# Patient Record
Sex: Male | Born: 1985 | Race: Black or African American | Marital: Single | Smoking: Never smoker
Health system: Southern US, Community
[De-identification: ages and names within clinical notes are randomized; demographics above are authoritative.]

---

## 2013-02-24 ENCOUNTER — Emergency Department (HOSPITAL_COMMUNITY)
Admission: EM | Admit: 2013-02-24 | Discharge: 2013-02-24 | Disposition: A | Discharge status: Home / Self Care | Payer: Managed Care, Other (non HMO) | Attending: Emergency Medicine | Admitting: Emergency Medicine

## 2013-02-24 ENCOUNTER — Emergency Department (HOSPITAL_COMMUNITY): Payer: Managed Care, Other (non HMO)

## 2013-02-24 ENCOUNTER — Encounter (HOSPITAL_COMMUNITY): Payer: Self-pay | Admitting: Emergency Medicine

## 2013-02-24 DIAGNOSIS — N50812 Left testicular pain: Secondary | ICD-10-CM

## 2013-02-24 DIAGNOSIS — N509 Disorder of male genital organs, unspecified: Secondary | ICD-10-CM | POA: Insufficient documentation

## 2013-02-24 LAB — URINALYSIS, ROUTINE W REFLEX MICROSCOPIC
Glucose, UA: NEGATIVE mg/dL
Leukocytes, UA: NEGATIVE
Nitrite: NEGATIVE
Specific Gravity, Urine: 1.022 (ref 1.005–1.030)
pH: 5.5 (ref 5.0–8.0)

## 2013-02-24 MED ORDER — ACETAMINOPHEN 325 MG PO TABS
650.0000 mg | ORAL_TABLET | Freq: Once | ORAL | Status: AC
  Administered 2013-02-24: 650 mg via ORAL
  Filled 2013-02-24: qty 2

## 2013-02-24 NOTE — ED Notes (Signed)
Pt dc to home.   Pt states understanding to dc instructions.  Pt ambulatory to exit without difficulty.  Pt denies need for w/c. 

## 2013-02-24 NOTE — ED Provider Notes (Signed)
History    This chart was scribed for non-physician practitioner Arthor Captain, PA-C working with Joya Gaskins, MD by Gerlean Ren, ED Scribe. This patient was seen in room TR05C/TR05C and the patient's care was started at 11:16 PM.   CSN: [id number]  Arrival date & time 02/24/13  1901   First MD Initiated Contact with Patient 02/24/13 2039      Chief Complaint  Patient presents with  . Testicle Pain     The history is provided by the patient. No language interpreter was used.  Tanner Anderson is a 27 y.o. male who presents to the Emergency Department complaining of moderate left testicle pain with sudden onset 2 days ago and waxing-and-waning since.  No associated trauma or injury to the area.  Pt reports he had similar pain 2 months ago that resolved on its own.  No recent sexual intercourse.  Pt has used ibuprofen without relief.  Pt denies penile discharge, testicular swelling, rectal pain, nausea, emesis, abdominal pain, dysuria, hematuria.  No hx kidney stones.   History reviewed. No pertinent past medical history.  History reviewed. No pertinent past surgical history.  No family history on file.  History  Substance Use Topics  . Smoking status: Never Smoker   . Smokeless tobacco: Not on file  . Alcohol Use: Yes      Review of Systems  Gastrointestinal: Negative for nausea, vomiting and abdominal pain.  Genitourinary: Positive for testicular pain. Negative for dysuria, hematuria, discharge and penile pain.       Negative testicular swelling  All other systems reviewed and are negative.    Allergies  Review of patient's allergies indicates no known allergies.  Home Medications  No current outpatient prescriptions on file.  BP 129/88  Pulse 75  Temp(Src) 98.2 F (36.8 C) (Oral)  Resp 16  SpO2 99%  Physical Exam  Nursing note and vitals reviewed. Constitutional: He is oriented to person, place, and time. He appears well-developed and well-nourished. No  distress.  HENT:  Head: Normocephalic and atraumatic.  Eyes: EOM are normal.  Neck: Neck supple. No tracheal deviation present.  Cardiovascular: Normal rate.   Pulmonary/Chest: Effort normal. No respiratory distress.  Genitourinary: Penis normal.  Normal shape, no masses, not swollen, epididymus non-tender to palpation, circumcised, no swelling heat or redness of scrotum, no sign of hernia, penis is without discharge  Musculoskeletal: Normal range of motion.  Neurological: He is alert and oriented to person, place, and time.  Skin: Skin is warm and dry.  Psychiatric: He has a normal mood and affect. His behavior is normal.    ED Course  Procedures (including critical care time) DIAGNOSTIC STUDIES: Oxygen Saturation is 99% on room air, normal by my interpretation.    COORDINATION OF CARE: 11:24 PM- Informed pt that urinalysis and [place] are all normal and do not indicate any acute conditions.  Informed pt that I will provide contact information for urologist and advised follow-up.  Pt verbalizes understanding and agrees with plan.  Results for orders placed during the hospital encounter of 02/24/13  URINALYSIS, ROUTINE W REFLEX MICROSCOPIC      Result Value Range   Color, Urine YELLOW  YELLOW   APPearance CLEAR  CLEAR   Specific Gravity, Urine 1.022  1.005 - 1.030   pH 5.5  5.0 - 8.0   Glucose, UA NEGATIVE  NEGATIVE mg/dL   Hgb urine dipstick NEGATIVE  NEGATIVE   Bilirubin Urine NEGATIVE  NEGATIVE   Ketones, ur NEGATIVE  NEGATIVE  mg/dL   Protein, ur NEGATIVE  NEGATIVE mg/dL   Urobilinogen, UA 0.2  0.0 - 1.0 mg/dL   Nitrite NEGATIVE  NEGATIVE   Leukocytes, UA NEGATIVE  NEGATIVE    US Scrotum  02/24/2013  *RADIOLOGY REPORT*  Clinical Data:  Left testicular pain. Rule out testicular torsion.  SCROTAL ULTRASOUND DOPPLER ULTRASOUND OF THE TESTICLES  Technique: Complete ultrasound examination of the testicles, epididymis, and other scrotal structures was performed.  Color and spectral  Doppler ultrasound were also utilized to evaluate blood flow to the testicles.  Comparison:  None available.  Findings:  Right testis:  The right testis is of normal size and echotexture, measuring 3.4 x 1.5 x 2.0 cm.  Normal color Doppler flow is present.  Left testis:  The left testis is of normal size and echotexture, measuring 3.2 x 1.4 x 2.1 cm.  Normal color Doppler flow is present.  Right epididymis:  Normal in size and appearance.  Left epididymis:  Normal in size and appearance.  Hydrocele:  Absent  Varicocele:  Absent  Pulsed Doppler interrogation of both testes demonstrates low resistance flow bilaterally.  IMPRESSION:  1.  Normal appearance of the scrotum bilaterally. 2.  Normal pulsed doppler interrogation without evidence for torsion.   Original Report Authenticated By: Marin Roberts, M.D.    [place] Art/ven Flow Abd Pelv Doppler  02/24/2013  *RADIOLOGY REPORT*  Clinical Data:  Left testicular pain. Rule out testicular torsion.  SCROTAL ULTRASOUND DOPPLER ULTRASOUND OF THE TESTICLES  Technique: Complete ultrasound examination of the testicles, epididymis, and other scrotal structures was performed.  Color and spectral Doppler ultrasound were also utilized to evaluate blood flow to the testicles.  Comparison:  None available.  Findings:  Right testis:  The right testis is of normal size and echotexture, measuring 3.4 x 1.5 x 2.0 cm.  Normal color Doppler flow is present.  Left testis:  The left testis is of normal size and echotexture, measuring 3.2 x 1.4 x 2.1 cm.  Normal color Doppler flow is present.  Right epididymis:  Normal in size and appearance.  Left epididymis:  Normal in size and appearance.  Hydrocele:  Absent  Varicocele:  Absent  Pulsed Doppler interrogation of both testes demonstrates low resistance flow bilaterally.  IMPRESSION:  1.  Normal appearance of the scrotum bilaterally. 2.  Normal pulsed doppler interrogation without evidence for torsion.   Original Report Authenticated By:  Marin Roberts, M.D.      1. Testicular pain, left       MDM  No masses, no signs of infection or epididymitis.  Patient is without torsion on [place].  Pain is currently resolved and never severe.  Last sexual encounter per patient was 3 years ago.  No penial discharge.  Urine is clear. No signs of kidney stone.  Will DC with urology follow up. The patient appears reasonably screened and/or stabilized for discharge and I doubt any other medical condition or other [facility] requiring further screening, evaluation, or treatment in the ED at this time prior to discharge.      I personally performed the services described in this documentation, which was scribed in my presence. The recorded information has been reviewed and is accurate.     Arthor Captain, PA-C 02/24/13 2339

## 2013-02-24 NOTE — ED Notes (Signed)
Intermittent L sided testicle pain x 2 days.  Reports he also had pain 2 months ago that resolved.

## 2013-02-26 NOTE — ED Provider Notes (Signed)
Medical screening examination/treatment/procedure(s) were performed by non-physician practitioner and as supervising physician I was immediately available for consultation/collaboration.   Cono Gebhard W Vonn Sliger, MD 02/26/13 0851 

## 2013-12-27 IMAGING — US US SCROTUM
1 series · 14 of 25 positions shown · non-contrast
Comparison: None available.

CLINICAL DATA: Left testicular pain. Rule out testicular torsion.

SCROTAL ULTRASOUND
DOPPLER ULTRASOUND OF THE TESTICLES
TECHNIQUE: Complete ultrasound examination of the testicles,
epididymis, and other scrotal structures was performed.  Color and
spectral Doppler ultrasound were also utilized to evaluate blood
flow to the testicles.

[Series 1: us scrotum · 0.08mm/px · 14 of 48 slices shown]
[im 1/48]
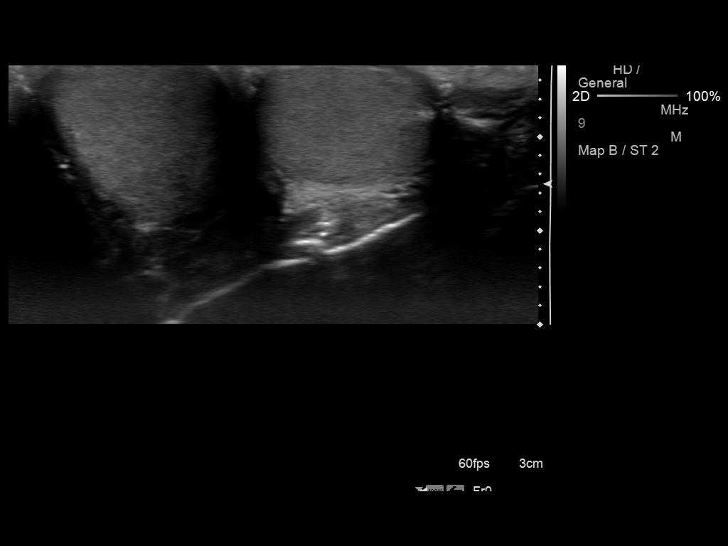
[im 4/48]
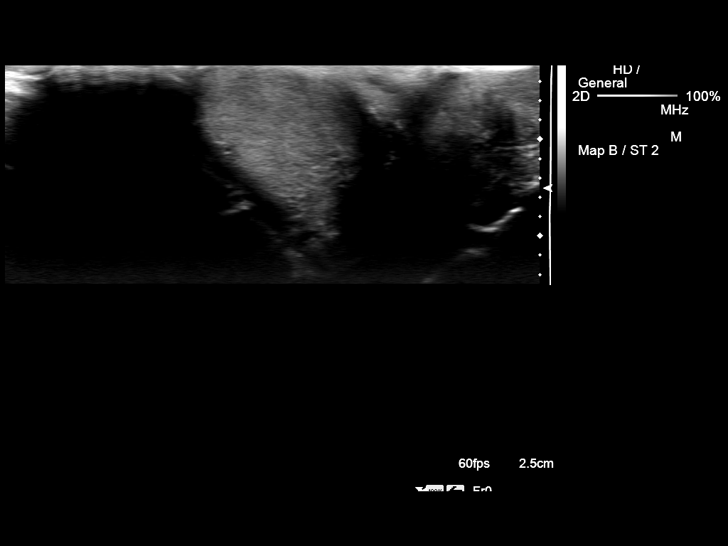
[im 8/48]
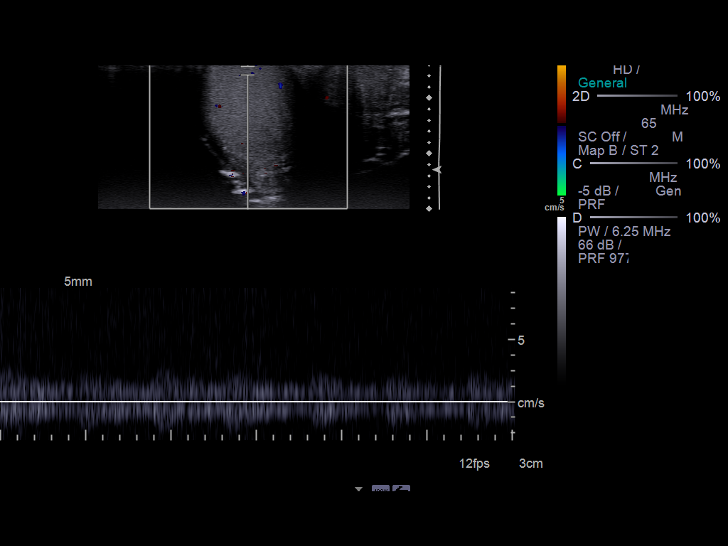
[im 12/48]
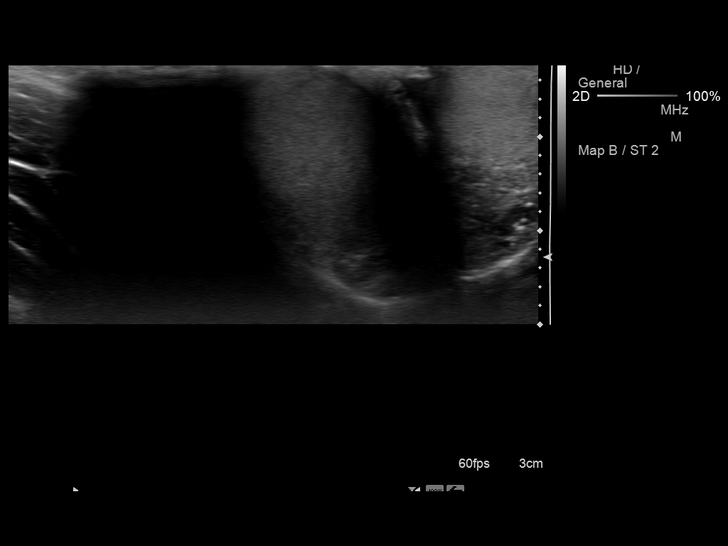
[im 16/48]
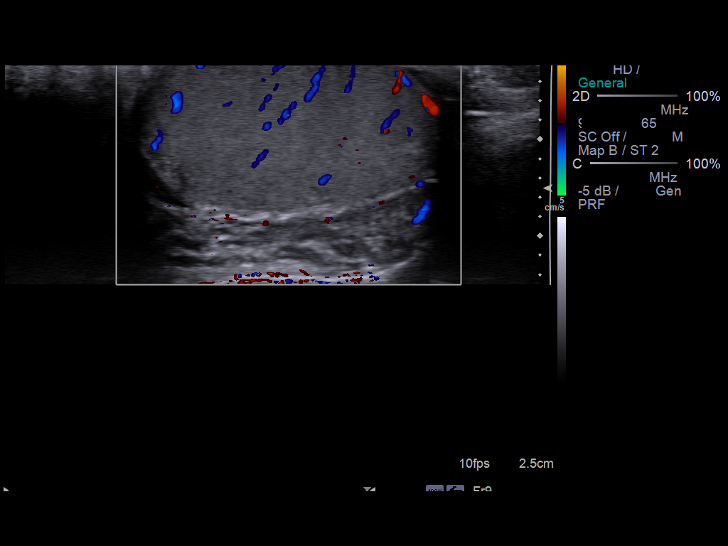
[im 18/48]
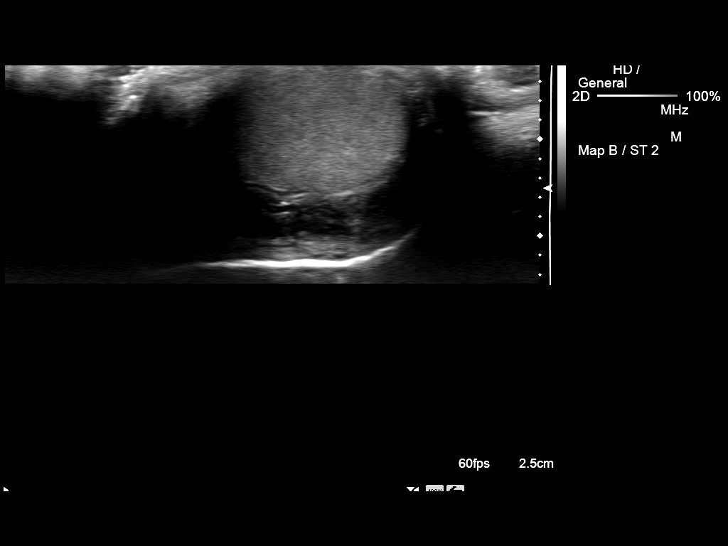
[im 22/48]
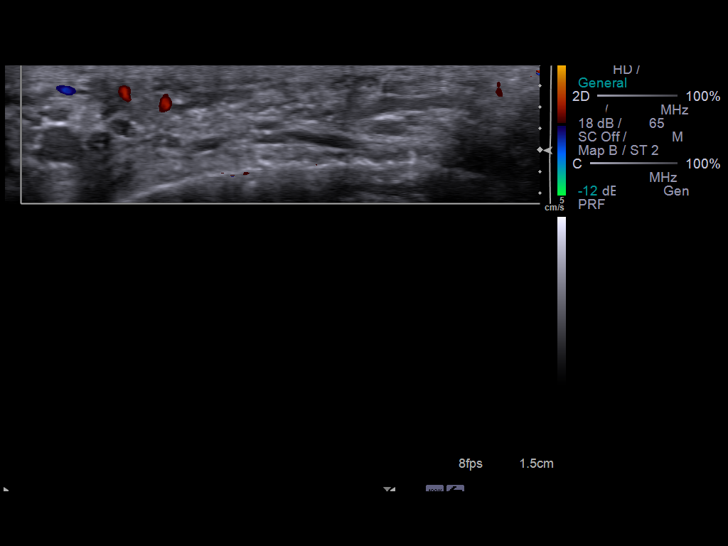
[im 26/48]
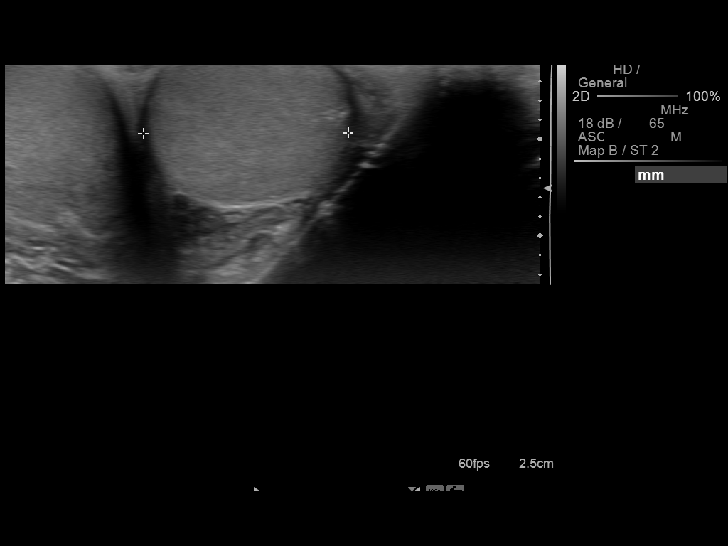
[im 30/48]
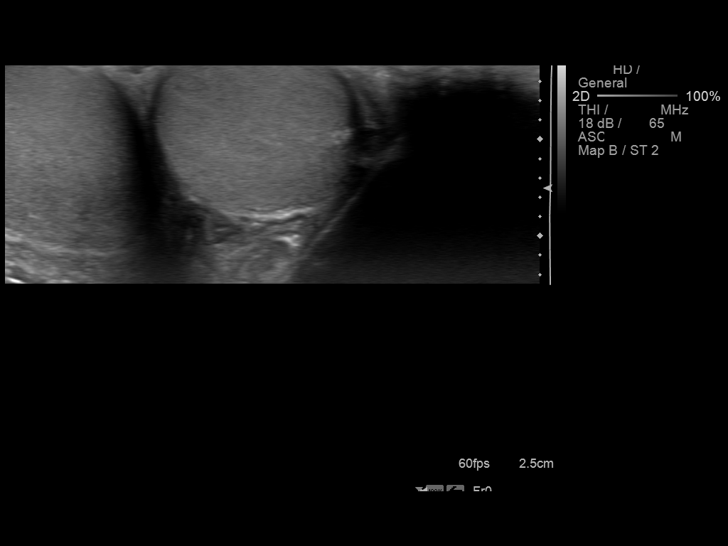
[im 32/48]
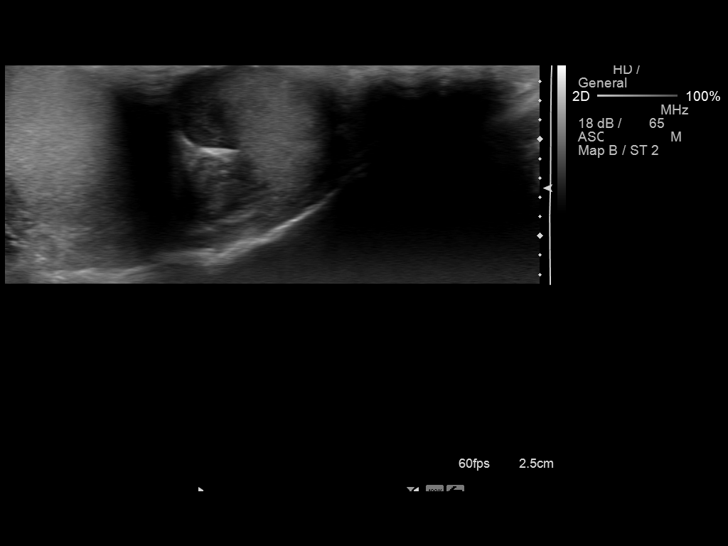
[im 36/48]
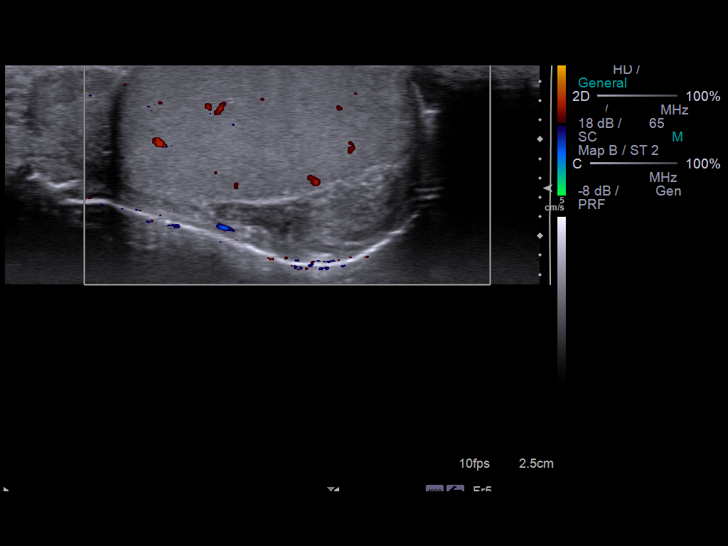
[im 40/48]
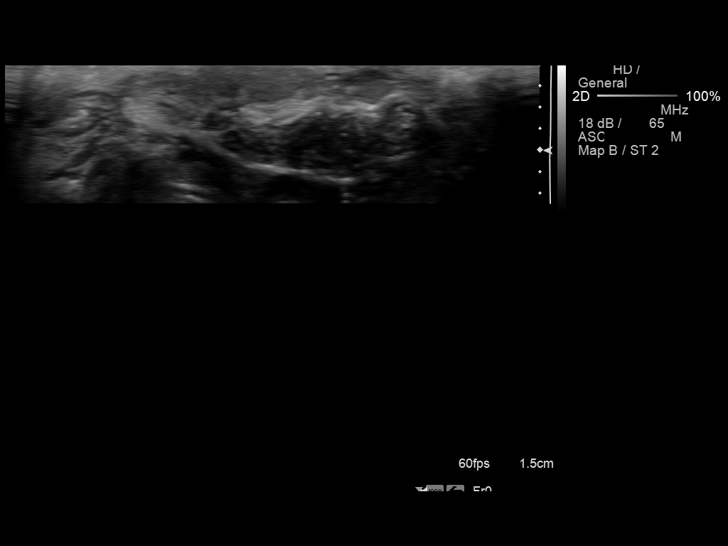
[im 44/48]
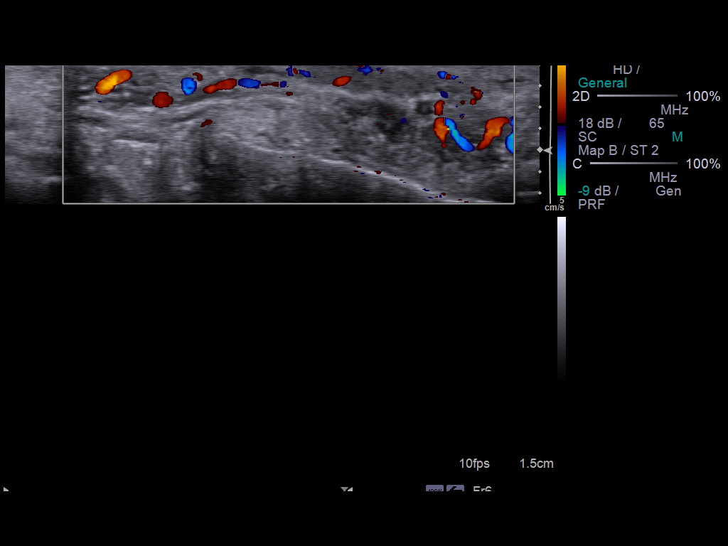
[im 48/48]
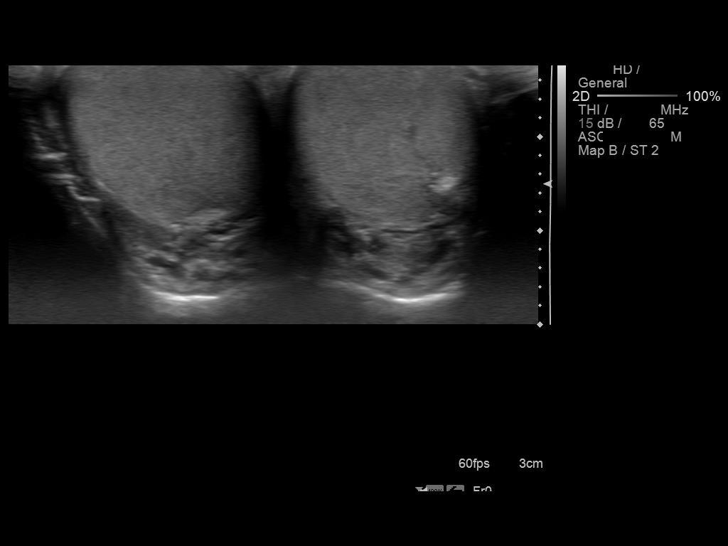

[14 of 25 positions shown; findings below may reference images not displayed]

FINDINGS: Right testis:  The right testis is of normal size and echotexture,
measuring 3.4 x 1.5 x 2.0 cm.  Normal color Doppler flow is
present.

Left testis:  The left testis is of normal size and echotexture,
measuring 3.2 x 1.4 x 2.1 cm.  Normal color Doppler flow is
present.

Right epididymis:  Normal in size and appearance.

Left epididymis:  Normal in size and appearance.

Hydrocele:  Absent

Varicocele:  Absent

Pulsed Doppler interrogation of both testes demonstrates low
resistance flow bilaterally.
IMPRESSION: 1.  Normal appearance of the scrotum bilaterally.
2.  Normal pulsed doppler interrogation without evidence for
torsion.

## 2014-09-02 ENCOUNTER — Ambulatory Visit (INDEPENDENT_AMBULATORY_CARE_PROVIDER_SITE_OTHER): Payer: BC Managed Care – PPO | Admitting: Family Medicine

## 2014-09-02 VITALS — BP 126/88 | HR 78 | Temp 98.6°F | Resp 16 | Ht 65.0 in | Wt 139.2 lb

## 2014-09-02 DIAGNOSIS — F4323 Adjustment disorder with mixed anxiety and depressed mood: Secondary | ICD-10-CM

## 2014-09-02 DIAGNOSIS — G4700 Insomnia, unspecified: Secondary | ICD-10-CM

## 2014-09-02 DIAGNOSIS — F4321 Adjustment disorder with depressed mood: Secondary | ICD-10-CM

## 2014-09-02 DIAGNOSIS — F1010 Alcohol abuse, uncomplicated: Secondary | ICD-10-CM

## 2014-09-02 DIAGNOSIS — Z634 Disappearance and death of family member: Secondary | ICD-10-CM

## 2014-09-02 MED ORDER — CLONAZEPAM 0.5 MG PO TABS: 0.5000 mg | ORAL_TABLET | Freq: Two times a day (BID) | ORAL | Status: AC | PRN

## 2014-09-02 NOTE — Patient Instructions (Signed)
Call [facility] for resources as they specialize in situations like you are in now: [address] [place], [place] [address]  [address] [address], [place], [place] [address]  [phone]  [facility] also has counseling and bereavement groups that should help: The professional, licensed staff of the [facility] can provide one-on-one counseling to you or your family members. To schedule an appointment for yourself, please call our [facility] at [phone]. Counseling appointments and support groups meet in [facility]'s main building at [employer].  For individual counseling - you can also call Gwendlyn DeutscherKim Goldstein at [phone].   Cut back on drinking as we discussed. You can take the clonazepam 1-2 twice per day as needed for anxiety or sleep if needed.   Out of work today, and if needed, I can take you out some next week if needed. Follow up with me next week.  Return to the clinic or go to the nearest emergency room if any of your symptoms worsen or new symptoms occur.

## 2014-09-02 NOTE — Progress Notes (Signed)
Subjective:    Patient ID: Tanner Anderson, male    DOB: 04/05/1986, 28 y.o.   MRN: [id number]  This chart was scribed for Merri Ray, MD by Edison Simon, ED Scribe. This patient was seen in room 4 and the patient's care was started at 11:04 AM.   Chief Complaint  Patient presents with  . Family Problem    family passed away--been drinking--noticed he had fast heart rate  . Loss of appetite  . Insomnia  . Weight Loss   HPI  HPI Comments: Tanner Anderson is a 28 y.o. male who presents to the [facility] complaining of insomnia, decreased appetite, weight loss, recent increased drinking, and elevated heart rate. He states his 53 year old son passed away on Aug 30, 2023. He states his son complained of stomach pain so they took him to [facility], where he was treated and was discharged on improvement, then he stared having symptoms again that night so they brought him back to [facility] where he passed away. He states he has not seen a counselor, but his job gave him some time off. He states he had 2 weeks off and went back on October 5th and has been doing okay, though he is anxious on and off. 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 He states he lives with his wife and 31 month old child. He states he has had decreased appetite. He states he has been drinking up to 4-5 tall boys of beers a day and also reports some liquor use. He denies any DUIs or having any trouble with work or the Sports coach. He does report using alcohol before going to work. He states that when he cannot eat, he drinks alcohol instead. He reports associated generalized weakness. He states he has difficulty getting to sleep because his mind is racing at night. He states he has tachycardia when he is anxious. He denies prior heart problems or prior problems with anxiety or depression; he denies significant medical history. He denies marijuana, cocaine,  or other drug use. He states that before this, he drank approximately 2 drinks per day. He denies suicidal ideation or homicidal ideation.   There are no active problems to display for this patient.  History reviewed. No pertinent past medical history. History reviewed. No pertinent past surgical history. No Known Allergies Prior to Admission medications   Not on File   History   Social History  . Marital Status: Single    Spouse Name: N/A    Number of Children: N/A  . Years of Education: N/A   Occupational History  . Not on file.   Social History Main Topics  . Smoking status: Never Smoker   . Smokeless tobacco: Not on file  . Alcohol Use: 1.0 - 1.5 oz/week    2-3 drink(s) per week  . Drug Use: No  . Sexual Activity: Not on file   Other Topics Concern  . Not on file   Social History Narrative  . No narrative on file    Review of Systems  Constitutional: Positive for appetite change and unexpected weight change.       Increased alcohol use  Cardiovascular: Positive for palpitations.  Neurological: Positive for weakness.  Psychiatric/Behavioral: Positive for sleep disturbance. Negative for suicidal ideas. The patient is nervous/anxious.        Objective:   Physical Exam  Vitals reviewed.  Constitutional: He is oriented to person, place, and time. He appears well-developed and well-nourished.  HENT:  Head: Normocephalic and atraumatic.  Eyes: Conjunctivae are normal.  Neck: Normal range of motion. Neck supple. No thyromegaly present.  Cardiovascular: Normal rate, regular rhythm and normal heart sounds.  Exam reveals no gallop and no friction rub.   No murmur heard. Pulmonary/Chest: Effort normal and breath sounds normal. No respiratory distress. He has no wheezes. He has no rales.  Abdominal: Soft. Bowel sounds are normal. He exhibits no distension. There is no tenderness. There is no rebound and no guarding.  Musculoskeletal: Normal range of motion.    Neurological: He is alert and oriented to person, place, and time.  Skin: Skin is warm and dry.  Psychiatric: His speech is normal and behavior is normal. Judgment normal. His affect is blunt. Cognition and memory are normal. He exhibits a depressed mood. He expresses no homicidal and no suicidal ideation.  Tearful at times during exam appropriately    Filed Vitals:   09/02/14 1056  BP: 126/88  Pulse: 78  Temp: 98.6 F (37 C)  TempSrc: Oral  Resp: 16  Height: 5' 5"  (1.651 m)  Weight: 139 lb 3.2 oz (63.141 kg)  SpO2: 99%       Assessment & Plan:   Tanner Anderson is a 28 y.o. male Adjustment disorder with mixed anxiety and depressed mood - Plan: clonazePAM (KLONOPIN) 0.5 MG tablet  Grief at loss of child - Plan: clonazePAM (KLONOPIN) 0.5 MG tablet  Alcohol abuse  Insomnia - Plan: clonazePAM (KLONOPIN) 0.5 MG tablet  Acute grief reaction adn adjustment d/o from recent loss of child.  Increased alcohol use and insomnia from event. Has not met with any support groups or counseling.   -discussed need for support in such a difficult time as this, and need for other treatments to lessen use of alcohol.   -phone numbers given to [facility] for group counseling, and called Darra Lis.  Her number was given to patient to initiate individual counseling if needed, but at minimum to start meeting with one of other resources.   -discussed cutting back on alcohol.  Klonopin was prescribed - SED, for short term use, and could consider SSRI if persistent difficulties.    -out of work note given.  Discussed longer leave if needed and FMLA and other ppwk from employer if needed.   -recheck next week, sooner if worse, or call if needed.   Meds ordered this encounter  Medications  . clonazePAM (KLONOPIN) 0.5 MG tablet    Sig: Take 1-2 tablets (0.5-1 mg total) by mouth 2 (two) times daily as needed for anxiety (or sleep).    Dispense:  20 tablet    Refill:  0   Patient  Instructions  Call [facility] for resources as they specialize in situations like you are in now: [place], [place] [address]  [place], [place], [place] [address]  [phone]  [facility] also has counseling and bereavement groups that should help: The professional, licensed staff of the [facility] [place] can provide one-on-one counseling to you or your family members. To schedule an appointment for yourself, please call our [place] at [phone]. Counseling appointments and support groups meet in [facility]'s main building at [employer].  For individual counseling - you can also call Darra Lis at [phone].   Cut back on drinking as we discussed. You can take the clonazepam 1-2 twice per  day as needed for anxiety or sleep if needed.   Out of work today, and if needed, I can take you out some next week if needed. Follow up with me next week.  Return to the clinic or go to the nearest emergency room if any of your symptoms worsen or new symptoms occur.

## 2015-04-20 ENCOUNTER — Ambulatory Visit (INDEPENDENT_AMBULATORY_CARE_PROVIDER_SITE_OTHER): Payer: BLUE CROSS/BLUE SHIELD | Admitting: Family Medicine

## 2015-04-20 ENCOUNTER — Ambulatory Visit (INDEPENDENT_AMBULATORY_CARE_PROVIDER_SITE_OTHER): Payer: BLUE CROSS/BLUE SHIELD

## 2015-04-20 VITALS — BP 118/80 | HR 86 | Temp 98.6°F | Resp 16 | Ht 65.0 in | Wt 135.8 lb

## 2015-04-20 DIAGNOSIS — Z113 Encounter for screening for infections with a predominantly sexual mode of transmission: Secondary | ICD-10-CM

## 2015-04-20 DIAGNOSIS — Z789 Other specified health status: Secondary | ICD-10-CM

## 2015-04-20 DIAGNOSIS — F1099 Alcohol use, unspecified with unspecified alcohol-induced disorder: Secondary | ICD-10-CM

## 2015-04-20 DIAGNOSIS — Z7289 Other problems related to lifestyle: Secondary | ICD-10-CM

## 2015-04-20 DIAGNOSIS — R1084 Generalized abdominal pain: Secondary | ICD-10-CM | POA: Diagnosis not present

## 2015-04-20 DIAGNOSIS — R300 Dysuria: Secondary | ICD-10-CM | POA: Diagnosis not present

## 2015-04-20 DIAGNOSIS — F1090 Alcohol use, unspecified, uncomplicated: Secondary | ICD-10-CM

## 2015-04-20 DIAGNOSIS — F329 Major depressive disorder, single episode, unspecified: Secondary | ICD-10-CM

## 2015-04-20 DIAGNOSIS — F32A Depression, unspecified: Secondary | ICD-10-CM

## 2015-04-20 LAB — COMPLETE METABOLIC PANEL WITH GFR
AST: 26 U/L (ref 0–37)
Alkaline Phosphatase: 70 U/L (ref 39–117)
Calcium: 9.8 mg/dL (ref 8.4–10.5)
Glucose, Bld: 71 mg/dL (ref 70–99)
Total Bilirubin: 1 mg/dL (ref 0.2–1.2)
Total Protein: 7.7 g/dL (ref 6.0–8.3)

## 2015-04-20 LAB — COMPLETE METABOLIC PANEL WITHOUT GFR
ALT: 19 U/L (ref 0–53)
Albumin: 4.8 g/dL (ref 3.5–5.2)
BUN: 8 mg/dL (ref 6–23)
CO2: 23 meq/L (ref 19–32)
Chloride: 103 meq/L (ref 96–112)
Creat: 0.77 mg/dL (ref 0.50–1.35)
GFR, Est African American: >89 mL/min
GFR, Est Non African American: >89 mL/min
Potassium: 3.7 meq/L (ref 3.5–5.3)
Sodium: 141 meq/L (ref 135–145)

## 2015-04-20 LAB — POCT CBC
Granulocyte percent: 53.1 % (ref 37–80)
HCT, POC: 51.6 % (ref 43.5–53.7)
Hemoglobin: 16.9 g/dL (ref 14.1–18.1)
Lymph, poc: 1.6 (ref 0.6–3.4)
MCH, POC: 29.5 pg (ref 27–31.2)
MCHC: 32.7 g/dL (ref 31.8–35.4)
MCV: 90.1 fL (ref 80–97)
MID (cbc): 0.2 (ref 0–0.9)
MPV: 9 fL (ref 0–99.8)
POC Granulocyte: 2 (ref 2–6.9)
POC LYMPH PERCENT: 42.7 % (ref 10–50)
POC MID %: 4.2 % (ref 0–12)
Platelet Count, POC: 133 10*3/uL — AB (ref 142–424)
RBC: 5.72 M/uL (ref 4.69–6.13)
RDW, POC: 13 %
WBC: 3.8 10*3/uL — AB (ref 4.6–10.2)

## 2015-04-20 LAB — POCT URINALYSIS DIPSTICK
Bilirubin, UA: NEGATIVE
Blood, UA: NEGATIVE
Glucose, UA: NEGATIVE
Ketones, UA: NEGATIVE
Leukocytes, UA: NEGATIVE
Nitrite, UA: NEGATIVE
Protein, UA: 300
Spec Grav, UA: 1.025
Urobilinogen, UA: 0.2
pH, UA: 7.5

## 2015-04-20 LAB — POCT UA - MICROSCOPIC ONLY
Casts, Ur, LPF, POC: NEGATIVE
Crystals, Ur, HPF, POC: NEGATIVE
Mucus, UA: NEGATIVE
RBC, urine, microscopic: NEGATIVE
WBC, Ur, HPF, POC: NEGATIVE
Yeast, UA: NEGATIVE

## 2015-04-20 LAB — LIPASE: Lipase: 41 U/L (ref 0–75)

## 2015-04-20 MED ORDER — CITALOPRAM HYDROBROMIDE 20 MG PO TABS: 20.0000 mg | ORAL_TABLET | Freq: Every day | ORAL | Status: AC

## 2015-04-20 NOTE — Progress Notes (Signed)
 Chief Complaint:  Chief Complaint  Patient presents with  . Back Pain    lower back x 2 months  . Flank Pain    both sides x 2 months    HPI: Tanner Anderson is a 29 y.o. male who is here for  1 month history of right sided abd pain for the last 1 month, coming from [place] and intermittent and also has left sided pain in the back . Right sided radiation. Has had some burning pain with uriantion some time for a 1-2 weeks off and on . No fevers, chill , nause aor vomiting. No rashes. No chest or SOB. He  Does not think it is his back. NO blood in urine or stool. No hx of kidney stone. NOt associated with food. Denies GERD, he does drink alcohol. But has decreased. No hx of STI, Normal and regular BMS every 1-2 days. Works in [employer], [employer]. No new exercises.   He has a history of bereavement and was on clonazepam after the loss of his 101 year old child. He has been over a year. He still feels up. Self depression where he cannot get out of bed to go to work. He is not on any medications for this. He denies any thoughts of hurting himself or anybody else. Denies any hallucinations or mania. Never been on any depression medicines like an SSRI. He has been on clonazepam when necessary before. He is currently still drinking a lot of alcohol but has cut down a lot since his son's death. History taking 1 shot of vodka daily.   History reviewed. No pertinent past medical history. History reviewed. No pertinent past surgical history. History   Social History  . Marital Status: Single    Spouse Name: N/A  . Number of Children: N/A  . Years of Education: N/A   Social History Main Topics  . Smoking status: Never Smoker   . Smokeless tobacco: Not on file  . Alcohol Use: 1.0 - 1.5 oz/week    2-3 drink(s) per week  . Drug Use: No  . Sexual Activity: Not on file   Other Topics Concern  . None   Social History Narrative   History reviewed. No pertinent family history. No  Known Allergies Prior to Admission medications   Medication Sig Start Date End Date Taking? Authorizing Provider  clonazePAM (KLONOPIN) 0.5 MG tablet Take 1-2 tablets (0.5-1 mg total) by mouth 2 (two) times daily as needed for anxiety (or sleep). Patient not taking: Reported on 04/20/2015 09/02/14   Shade Flood, MD     ROS: The patient denies fevers, chills, night sweats, unintentional weight loss, chest pain, palpitations, wheezing, dyspnea on exertion, nausea, vomiting,  hematuria, melena, numbness, weakness, or tingling.  All other systems have been reviewed and were otherwise negative with the exception of those mentioned in the HPI and as above.    PHYSICAL EXAM: Filed Vitals:   04/20/15 1029  BP: 118/80  Pulse: 86  Temp: 98.6 F (37 C)  Resp: 16   Filed Vitals:   04/20/15 1029  Height:  (1.651 m)  Weight: 135 lb 12.8 oz (61.598 kg)   Body mass index is 22.6 kg/(m^2).  General: Alert, no acute distress HEENT:  Normocephalic, atraumatic, oropharynx patent. EOMI, PERRLA, no scleral icterus, fundo exam nl Cardiovascular:  Regular rate and rhythm, no rubs murmurs or gallops.  No Carotid bruits, radial pulse intact. No pedal edema.  Respiratory: Clear to auscultation bilaterally.  No wheezes, rales, or rhonchi.  No cyanosis, no use of accessory musculature GI: No organomegaly, abdomen is soft and non-tender, positive bowel sounds.  No masses.No peritoneal signs Skin: No rashes. Neurologic: Facial musculature symmetric. Psychiatric: Patient is appropriate throughout our interaction. Lymphatic: No cervical lymphadenopathy Musculoskeletal: Gait intact. No apprecaible hernia   LABS: Results for orders placed or performed in visit on 04/20/15  POCT CBC  Result Value Ref Range   WBC 3.8 (A) 4.6 - 10.2 K/uL   Lymph, poc 1.6 0.6 - 3.4   POC LYMPH PERCENT 42.7 10 - 50 %L   MID (cbc) 0.2 0 - 0.9   POC MID % 4.2 0 - 12 %M   POC Granulocyte 2.0 2 - 6.9   Granulocyte  percent 53.1 37 - 80 %G   RBC 5.72 4.69 - 6.13 M/uL   Hemoglobin 16.9 14.1 - 18.1 g/dL   HCT, POC 13.251.6 44.043.5 - 53.7 %   MCV 90.1 80 - 97 fL   MCH, POC 29.5 27 - 31.2 pg   MCHC 32.7 31.8 - 35.4 g/dL   RDW, POC 10.213.0 %   Platelet Count, POC 133 (A) 142 - 424 K/uL   MPV 9.0 0 - 99.8 fL  POCT UA - Microscopic Only  Result Value Ref Range   WBC, Ur, HPF, POC neg    RBC, urine, microscopic neg    Bacteria, U Microscopic Trace    Mucus, UA neg    Epithelial cells, urine per micros 1-2    Crystals, Ur, HPF, POC neg    Casts, Ur, LPF, POC neg    Yeast, UA neg   POCT urinalysis dipstick  Result Value Ref Range   Color, UA yellow    Clarity, UA clear    Glucose, UA neg    Bilirubin, UA neg    Ketones, UA neg    Spec Grav, UA 1.025    Blood, UA neg    pH, UA 7.5    Protein, UA 300    Urobilinogen, UA 0.2    Nitrite, UA neg    Leukocytes, UA Negative      EKG/XRAY:   Primary read [facility]. Negative for any acute abdominal or cardiac process. No urinary stones noted.   ASSESSMENT/PLAN: Encounter Diagnoses  Name Primary?  . Generalized abdominal pain Yes  . Alcohol use   . Dysuria   . Screening for STD (sexually transmitted disease)   . Depression    Follow-up in 6 weeks Labs pending, if his liver and kidneys are doing well that I can consider giving him some muscle relaxer or Mobic. He was prescribed Celexa 20 mg daily He was given resources for counseling. I suspect that he is not interested in this since he has other things that he needs to deal with. Currently denies any suicidal, homicidal thoughts. No hallucinations.  Gross sideeffects, risk and benefits, and alternatives of medications d/w patient. Patient is aware that all medications have potential sideeffects and we are unable to predict every sideeffect or drug-drug interaction that may occur.  Hamilton CapriLE,  PHUONG, DO 04/20/2015 12:27 PM

## 2015-04-20 NOTE — Patient Instructions (Addendum)
Citalopram tablets  What is this medicine?  CITALOPRAM (sye TAL oh pram) is a medicine for depression.  This medicine may be used for other purposes; ask your health care provider or pharmacist if you have questions.  COMMON BRAND NAME(S): Celexa  What should I tell my health care provider before I take this medicine?  They need to know if you have any of these conditions:  -bipolar disorder or a family history of bipolar disorder  -diabetes  -glaucoma  -heart disease  -history of irregular heartbeat  -kidney or liver disease  -low levels of magnesium or potassium in the blood  -receiving electroconvulsive therapy  -seizures (convulsions)  -suicidal thoughts or a previous suicide attempt  -an unusual or allergic reaction to citalopram, escitalopram, other medicines, foods, dyes, or preservatives  -pregnant or trying to become pregnant  -breast-feeding  How should I use this medicine?  Take this medicine by mouth with a glass of water. Follow the directions on the prescription label. You can take it with or without food. Take your medicine at regular intervals. Do not take your medicine more often than directed. Do not stop taking this medicine suddenly except upon the advice of your doctor. Stopping this medicine too quickly may cause serious side effects or your condition may worsen.  A special MedGuide will be given to you by the pharmacist with each prescription and refill. Be sure to read this information carefully each time.  Talk to your pediatrician regarding the use of this medicine in children. Special care may be needed.  Patients over 60 years old may have a stronger reaction and need a smaller dose.  Overdosage: If you think you have taken too much of this medicine contact a poison control center or emergency room at once.  NOTE: This medicine is only for you. Do not share this medicine with others.  What if I miss a dose?  If you miss a dose, take it as soon as you can. If it is almost time for your  next dose, take only that dose. Do not take double or extra doses.  What may interact with this medicine?  Do not take this medicine with any of the following medications:  -certain medicines for fungal infections like fluconazole, itraconazole, ketoconazole, posaconazole, voriconazole  -cisapride  -dofetilide  -dronedarone  -escitalopram  -linezolid  -MAOIs like Carbex, Eldepryl, Marplan, Nardil, and Parnate  -methylene blue (injected into a vein)  -pimozide  -thioridazine  -ziprasidone  This medicine may also interact with the following medications:  -alcohol  -aspirin and aspirin-like medicines  -carbamazepine  -certain medicines for depression, anxiety, or psychotic disturbances  -certain medicines for infections like chloroquine, clarithromycin, erythromycin, furazolidone, isoniazid, pentamidine  -certain medicines for migraine headaches like almotriptan, eletriptan, frovatriptan, naratriptan, rizatriptan, sumatriptan, zolmitriptan  -certain medicines for sleep  -certain medicines that treat or prevent blood clots like dalteparin, enoxaparin, warfarin  -cimetidine  -diuretics  -fentanyl  -lithium  -methadone  -metoprolol  -NSAIDs, medicines for pain and inflammation, like ibuprofen or naproxen  -omeprazole  -other medicines that prolong the QT interval (cause an abnormal heart rhythm)  -procarbazine  -rasagiline  -supplements like St. John's wort, kava kava, valerian  -tramadol  -tryptophan  This list may not describe all possible interactions. Give your health care provider a list of all the medicines, herbs, non-prescription drugs, or dietary supplements you use. Also tell them if you smoke, drink alcohol, or use illegal drugs. Some items may interact with your medicine.  What   families should watch out for new or worsening thoughts of suicide or depression. Also watch out for sudden changes in feelings such as feeling anxious, agitated, panicky, irritable, hostile, aggressive, impulsive, severely restless, overly excited and hyperactive, or not being able to sleep. If this happens, especially at the beginning of treatment or after a change in dose, call your health care professional. Tanner Anderson may get drowsy or dizzy. Do not drive, use machinery, or do anything that needs mental alertness until you know how this medicine affects you. Do not stand or sit up quickly, especially if you are an older patient. This reduces the risk of dizzy or fainting spells. Alcohol may interfere with the effect of this medicine. Avoid alcoholic drinks. Your mouth may get dry. Chewing sugarless gum or sucking hard candy, and drinking plenty of water will help. Contact your doctor if the problem does not go away or is severe. What side effects may I notice from receiving this medicine? Side effects that you should report to your doctor or health care professional as soon as possible: -allergic reactions like skin rash, itching or hives, swelling of the face, lips, or tongue -chest pain -confusion -dizziness -fast, irregular heartbeat -fast talking and excited feelings or actions that are out of control -feeling faint or lightheaded, falls -hallucination, loss of contact with reality -seizures -shortness of breath -suicidal thoughts or other mood changes -unusual bleeding or bruising Side effects that usually do not require medical attention (report to your doctor or health care professional if they continue or are bothersome): -blurred vision -change in appetite -change in sex drive or performance -headache -increased sweating -nausea -trouble  sleeping This list may not describe all possible side effects. Call your doctor for medical advice about side effects. You may report side effects to FDA at 1-800-FDA-1088. Where should I keep my medicine? Keep out of reach of children. Store at room temperature between 15 and 30 degrees C (59 and 86 degrees F). Throw away any unused medicine after the expiration date. NOTE: This sheet is a summary. It may not cover all possible information. If you have questions about this medicine, talk to your doctor, pharmacist, or health care provider.  2015, Elsevier/Gold Standard. (2013-05-28 13:19:48) Depression  Depression is feeling sad, low, down in the dumps, blue, gloomy, or empty. In general, there are two kinds of depression:  Normal sadness or grief. This can happen after something upsetting. It often goes away on its own within 2 weeks. After losing a loved one (bereavement), normal sadness and grief may last longer than two weeks. It usually gets better with time.  Clinical depression. This kind lasts longer than normal sadness or grief. It keeps you from doing the things you normally do in life. It is often hard to function at home, work, or at school. It may affect your relationships with others. Treatment is often needed. GET HELP RIGHT AWAY IF:  You have thoughts about hurting yourself or others.  You lose touch with reality (psychotic symptoms). You may:  See or hear things that are not real.  Have untrue beliefs about your life or people around you.  Your medicine is giving you problems. MAKE SURE YOU:  Understand these instructions.  Will watch your condition.  Will get help right away if you are not doing well or get worse. Document Released: 12/07/2010 Document Revised: 03/21/2014 Document Reviewed: 03/05/2012 [facility] Patient Information 2015 [place], [place]. This information is not intended to replace advice given to you by your  health care provider. Make sure you  discuss any questions you have with your health care provider.   RESOURCE GUIDE  Chronic Pain Problems:  Contact [facility] [phone]  Patients need to be referred by their primary care doctor.  Insufficient Money for Medicine:  Contact United Way: call [phone]  No Primary Care Doctor:  Call Health Connect [phone] - can help you locate a primary care doctor that accepts your insurance, provides certain services, etc.  Physician Referral Service- [phone] Agencies that provide inexpensive medical care:  [facility] [phone]  [facility] [phone]  [facility] [phone]  [facility] [phone]  Planned Parenthood [phone]  [facility] [phone] Medicaid-accepting [facility] Providers:  [facility]- [address], [phone], Mon-Fri 9am-7pm, Sat 9am-1pm  [facility]- [address], [address] [place] [phone]  [facility]- [address], [address] [place] [phone]  [facility]- [address] [place] [address] [phone]  Renaye RakersVeita Bland- [address], [address], [phone] Only accepts [place] patients after they have their name applied to their card  Self Pay (no insurance) in [facility] [place]:  Sickle Cell Patients - [facility] [address], [phone]  [facility]- [address] [phone]  Redge Gainer- [facility]- [address]  - [facility]- see information above (Speak to CitigroupPam H if you do not have insurance)  - [facility] [place]- [address], [phone]  - [facility]- [address] [place] [address], [phone]  - Dr Julio Sickssei-Bonsu- [address], [address], [place], [phone]  - [facility]gent [facility] and [facility] - [address], [phone]  - [facility] [place]- [address] [place] [address], [phone], also [address], [phone]  - [facility]- [address], [phone], 1st & 3rd Saturday  every month, 10am-1pm  - [facility]  [address], [place].  Phone: [phone], Fax: [phone]. Hours of Operation: 9 am - 6 pm, M-F.  - [facility]  [address], [address], [place]  Phone: [phone], Fax: [phone]. Hours of Operation: 8:30 am - 5:30 pm, M-F.  [facility]  [address]  [place], [place] [address]  [phone]  [facility]  [address]  [place], [place] [address]  [phone]  1) Find a Doctor and Pay Out of Pocket  Although you won't have to find out who is covered by your insurance plan, it is a good idea to ask around and get recommendations. You will then need to call the office and see if the doctor you have chosen will accept you as a new patient and what types of options they offer for patients who are self-pay. Some doctors offer discounts or will set up payment plans for their patients who do not have insurance, but you will need to ask so you aren't surprised when you get to your appointment.  2) Contact Your Local Health Department  Not all health departments have doctors that can see patients for sick visits, but many do, so it is worth a call to see if yours does. If you don't know where your local health department is, you can check in your phone book. The CDC also has a tool to  help you locate your state's health department, and many state websites also have listings of all of their local health departments.  3) Find a Walk-in Clinic  If your illness is not likely to be very severe or complicated, you may want to try a walk in clinic. These are popping up all over the country in pharmacies, drugstores, and shopping centers. They're usually staffed by nurse practitioners or physician assistants that have been trained to treat common illnesses and complaints. They're  usually fairly quick and inexpensive. However, if you have serious medical issues or chronic medical problems, these are probably not your best option  STD Testing  [facility] Department of [facility] [place], STD Clinic, [address], [place], phone [phone] or [phone]. Monday - Friday, call for an appointment.  [facility] Department of [employer], STD Clinic, [address], [place], phone [phone] or [phone]. Monday - Friday, call for an appointment. Abuse/Neglect:  [facility] Child Abuse Hotline 858-241-6049  [facility] Child Abuse Hotline [phone] (After Hours) Emergency Shelter: [facility] 540-857-7890  Maternity Homes:  Room at the [place] [facility] [phone]  [facility] 717-460-7548 MRSA Hotline #: (813)268-6283  Dental Assistance  If unable to pay or uninsured, contact: [facility] to become qualified for the adult dental clinic.  Patients with Medicaid: [facility]  [phone] [address], [phone]  [address], [phone]  If unable to pay, or uninsured, contact [facility] [phone] in [place], [phone] in [facility]) to become qualified for the adult dental clinic  [facility]  [address]  [place], [place] [address]  [phone]  [email or web address]  Other Proofreader Services:  [facility]- [address] [place], [place], [place], [address], [phone], Ext. [phone], 2nd and 4th Thursday of the month at 6:30am. 10 clients each day by appointment, can sometimes see walk-in patients if someone does not show for an appointment.  [facility]- [address] [place] [place], [place], [address], [phone]  [facility] [address], [place], [place], [address], [phone]  [facility] Health Department- [phone]  [facility] Health Department- [phone]   [facility] Health [phone] Behavioral Health Resources in the [facility]  Intensive Outpatient Programs:  [facility]  [address]  [place], [place]  [phone]  Both a day and evening program  [facility]  [address]  [place], [place] [address]  [phone]  ADS: Alcohol & Drug Svcs  [address]  [place]  [phone]  [facility]  ACCESS LINE: [phone] or [phone]  [address]  [place], [place] [address]  [email or web address].[email or web address]  Substance Abuse Resources:  Alcohol and Drug Services [phone])[phone]  [facility] [phone]  [facility] [place] [phone]  Floydene Flock [phone]  Residential & Outpatient Substance Abuse Program [phone] Psychological Services:  [facility] [phone]  [facility] [phone]  [facility], [phone] [place]. [address], [place], ACCESS LINE: (707)[phone] or [phone], [email or web address].[email or web address] Mobile Crisis Teams:  [facility]rapeutic [facility]atives  Mobile Crisis Care Unit  231-562-3885  Assertive  Psychotherapeutic Services  [address] Ginette Otto  [phone]  Interventionist  [address]  [address]  [place]  [phone]  Self-Help/Support Groups:  Mental Health Assoc. of [employer] of support groups  8175644591 (call for more info)  Narcotics Anonymous (NA)  Caring Services  [address]  [place] - 2 meetings at this location  Residential Treatment Programs:  [facility] Residential Treatment  [address]  [place]  [phone]  [facility]  [address], [place] [address]  [place], [place] [address]  [phone]  [facility] Treatment Facility  [address] [place], [place] [address]  [phone]  Admissions: 8am-3pm M-F  Incentives Substance Abuse  Treatment Center  [address]  [place], [place] [address]  [phone]  [facility]  [address]  [place], [place]  [phone]  [facility]  [address]  [place], [place]  [phone]  [facility] - Intensive Outpatient  [address]  [place], [place]  [phone]  [facility] (Addiction Recovery [facility]  [address]  [place], [place]  [phone] or [phone]  Residential Treatment Services (RTS), Medicaid  [address]  [place], [place]  [phone]  Fellowship [address]  [address]  [place]  [phone]  [facility] Resources:  [phone]  General Therapy  Angie Fava, PhD  [address] [place], [place] [address]  [phone]  Insurance  [facility] Behavioral  [address]  [place], [place] [address]  [phone]  [facility]  [address] [place], [place] [address]  [phone]  Insurance/Medicaid/sponsorship through [facility] and Families  [address]  [place], [place] [address]  Therapy/tele-psych/case  [phone]  [facility]  [address], [place] [address]  Adolescent/group home/case management  [phone]  Creola Corn PhD  General therapy  Insurance  [phone]  Dr. Lolly Mustache, [place], M-F  [phone]  [facility] [employer] [facility] Dept.  [address] [place] [address]  Blondell Reveal  Phone: [phone] Phone: [phone] Phone: [phone]  [facility], [phone]  [facility] [place]- [phone] - [facility] in [place], [address]  [phone], Insurance  [place] Child Abuse Hotline  ([phone] or [phone] (After Hours)

## 2015-04-21 LAB — GC/CHLAMYDIA PROBE AMP
CT Probe RNA: NEGATIVE
GC Probe RNA: NEGATIVE

## 2015-04-21 LAB — TSH: TSH: 1.083 u[IU]/mL (ref 0.350–4.500)

## 2015-04-23 DIAGNOSIS — F329 Major depressive disorder, single episode, unspecified: Secondary | ICD-10-CM | POA: Insufficient documentation

## 2015-04-23 DIAGNOSIS — F32A Depression, unspecified: Secondary | ICD-10-CM | POA: Insufficient documentation

## 2015-04-24 ENCOUNTER — Telehealth: Payer: Self-pay | Admitting: Family Medicine

## 2015-04-24 NOTE — Telephone Encounter (Signed)
No longer having pain, discussed labs.

## 2016-02-20 IMAGING — CR DG ABDOMEN ACUTE W/ 1V CHEST
3 series · 3 of 3 positions shown · non-contrast
Comparison: None.

CLINICAL DATA: 29-year-old male with right side abdominal pain for
1 month. Initial encounter.

EXAM:
DG ABDOMEN ACUTE W/ 1V CHEST

[PA]
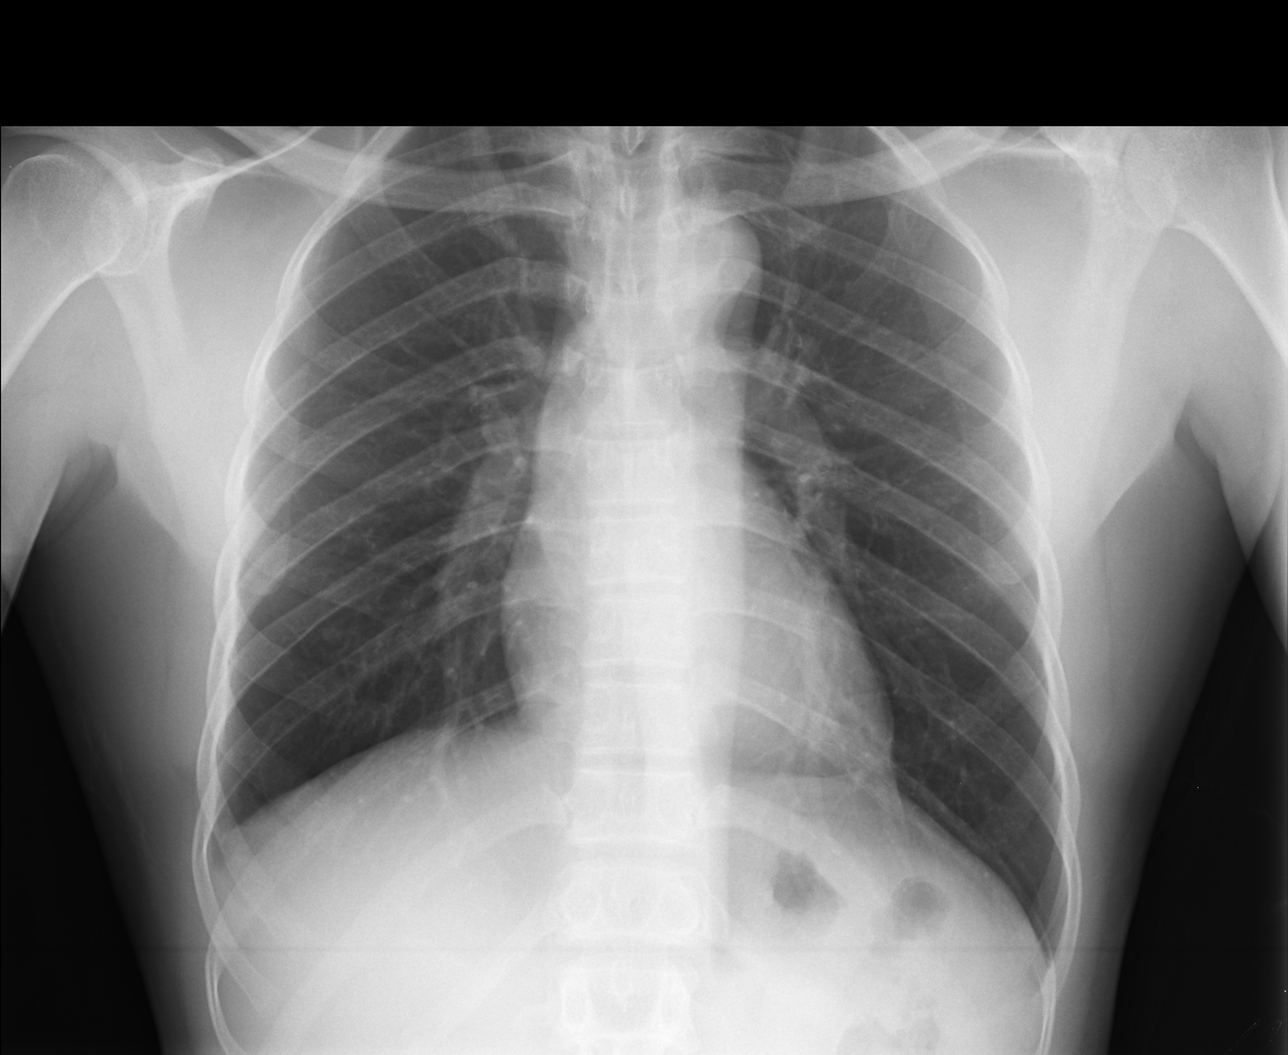

[AP (1 of 2)]
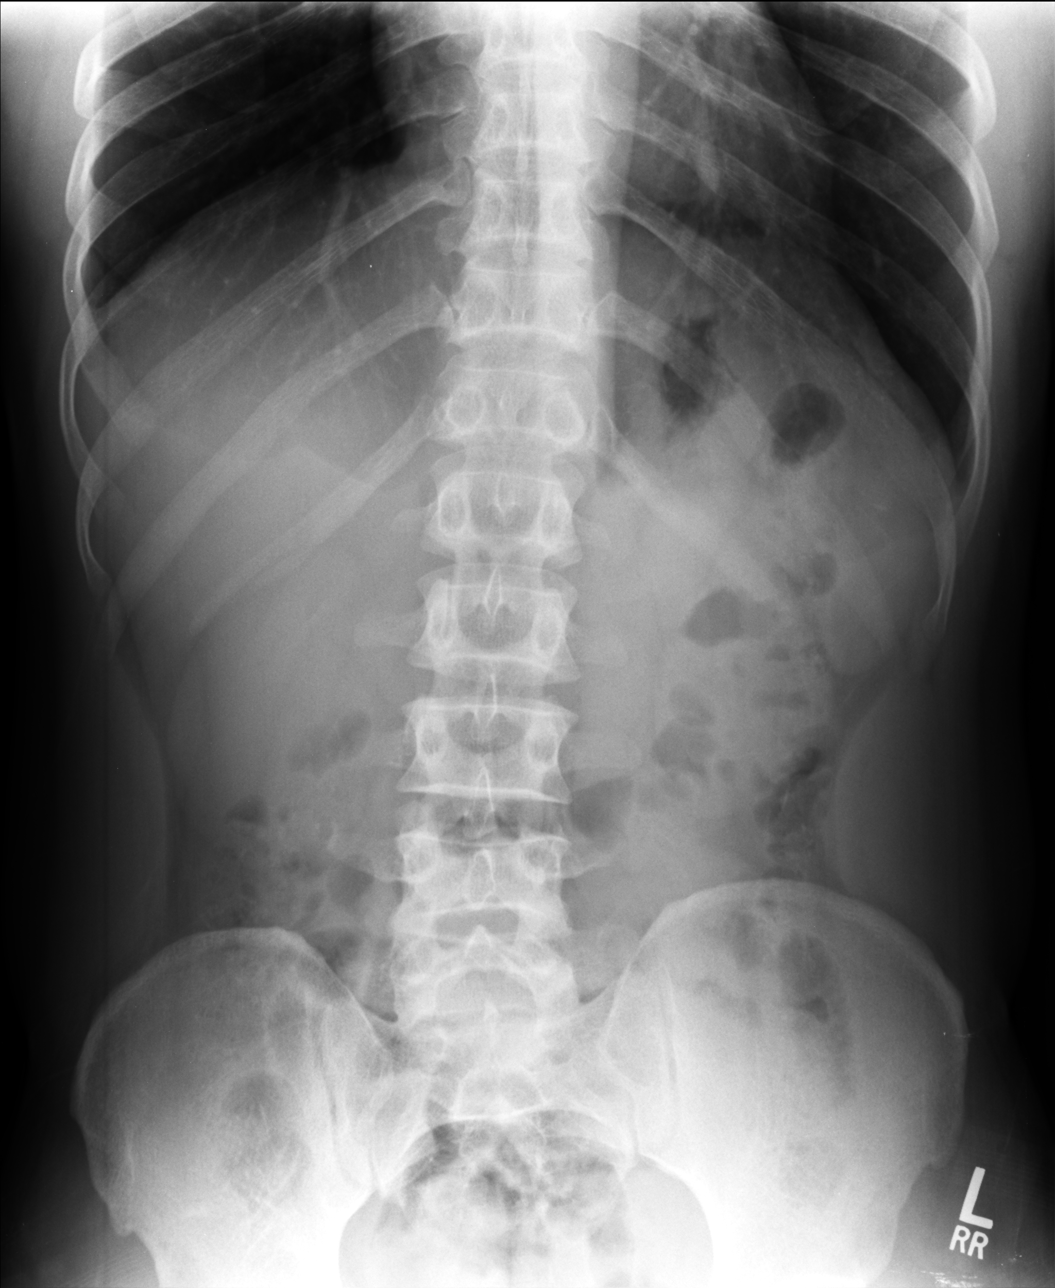

[AP (2 of 2)]
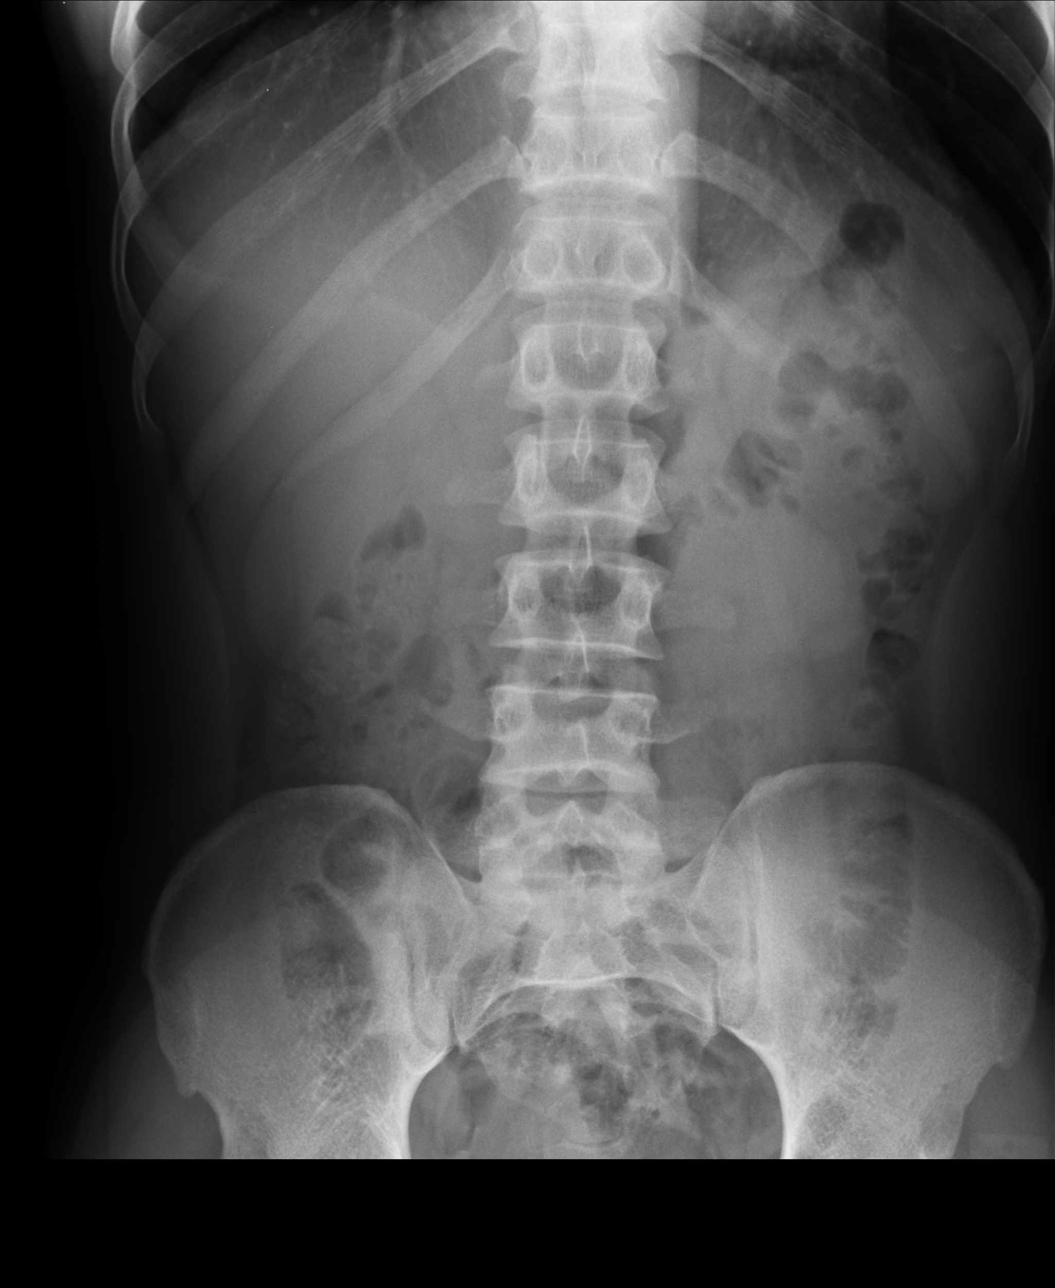

[3 of 3 positions shown; findings below may reference images not displayed]

FINDINGS: PA view of the chest. Normal cardiac size and mediastinal contours.
Visualized tracheal air column is within normal limits. The lungs
are clear. No pneumothorax or pneumoperitoneum.

Non obstructed bowel gas pattern. Abdominal and visible pelvic
visceral contours are within normal limits. No acute osseous
abnormality identified.
IMPRESSION: Normal bowel gas pattern, no free air.  Negative chest.
